# Patient Record
Sex: Male | Born: 1965 | Hispanic: Yes | Marital: Married | Smoking: Never smoker
Health system: Southern US, Community
[De-identification: ages and names within clinical notes are randomized; demographics above are authoritative.]

## PROBLEM LIST (undated history)

## (undated) DIAGNOSIS — E079 Disorder of thyroid, unspecified: Secondary | ICD-10-CM

## (undated) DIAGNOSIS — M1990 Unspecified osteoarthritis, unspecified site: Secondary | ICD-10-CM

## (undated) HISTORY — DX: Unspecified osteoarthritis, unspecified site: M19.90

## (undated) HISTORY — DX: Disorder of thyroid, unspecified: E07.9

## (undated) HISTORY — PX: APPENDECTOMY: SHX54

---

## 2016-11-10 ENCOUNTER — Ambulatory Visit (INDEPENDENT_AMBULATORY_CARE_PROVIDER_SITE_OTHER): Payer: Self-pay

## 2016-11-10 ENCOUNTER — Encounter: Payer: Self-pay | Admitting: Urgent Care

## 2016-11-10 ENCOUNTER — Ambulatory Visit (INDEPENDENT_AMBULATORY_CARE_PROVIDER_SITE_OTHER): Payer: Self-pay | Admitting: Urgent Care

## 2016-11-10 VITALS — BP 124/82 | HR 77 | Temp 98.8°F | Resp 18 | Ht 67.5 in | Wt 181.6 lb

## 2016-11-10 DIAGNOSIS — M542 Cervicalgia: Secondary | ICD-10-CM

## 2016-11-10 DIAGNOSIS — M722 Plantar fascial fibromatosis: Secondary | ICD-10-CM

## 2016-11-10 DIAGNOSIS — R5383 Other fatigue: Secondary | ICD-10-CM

## 2016-11-10 DIAGNOSIS — M2550 Pain in unspecified joint: Secondary | ICD-10-CM

## 2016-11-10 DIAGNOSIS — M5030 Other cervical disc degeneration, unspecified cervical region: Secondary | ICD-10-CM

## 2016-11-10 MED ORDER — METHYLPREDNISOLONE ACETATE 80 MG/ML IJ SUSP
80.0000 mg | Freq: Once | INTRAMUSCULAR | Status: AC
  Administered 2016-11-10: 80 mg via INTRAMUSCULAR

## 2016-11-10 NOTE — Progress Notes (Signed)
MRN: [id number] DOB: 08/20/1965  Subjective:   Derek Knapp is a 51 y.o. male presenting for chief complaint of Joint Pain  Reports 4 month history of neck pain that radiates to his right trapezius, right shoulder. His pain is achy, intermittent, worse with activity. Admits that he can have some radicular pain into his arms bilaterally. Has taken Advil with good relief. He received an injection ~3 months ago at an orthopedic practice for left shoulder pain after showed arthritis. Admits that this did give him good relief. 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 Reports multiple joint pains, aching elbows (L>R), elbow popping, finger joint pain, pain in his feet bilaterally. Has tried shoe inserts with minimal relief. Hydrates very well. Denies fever, trauma, falls, work accidents, car accidents, rashes. Pains started without known incident. He is concerned that he has lupus after researching it online.  Derek Knapp has a current medication list which includes the following prescription(s): acetaminophen and ibuprofen. Also has no allergies on file.  Derek Knapp  has a past medical history of Arthritis and Thyroid disease. Also  has a past surgical history that includes Appendectomy.  Objective:   Vitals: BP 124/82 (BP Location: Right Arm, Patient Position: Sitting, Cuff Size: Large)   Pulse 77   Temp 98.8 F (37.1 C) (Oral)   Resp 18   Ht 5' 7.5" (1.715 m)   Wt 181 lb 9.6 oz (82.4 kg)   SpO2 96%   BMI 28.02 kg/m   Physical Exam  Constitutional: He is oriented to person, place, and time. He appears well-developed and well-nourished.  Cardiovascular: Normal rate.   Pulmonary/Chest: Effort normal.  Musculoskeletal:       Right shoulder: He exhibits tenderness (over AC joint). He exhibits normal range of motion, no swelling, no effusion, no crepitus, no deformity, no spasm and normal strength.       Left shoulder: He exhibits normal range  of motion, no tenderness, no bony tenderness, no swelling, no effusion, no crepitus, no deformity, no laceration, no spasm and normal strength.       Left elbow: He exhibits swelling (over lateral epicondyle). He exhibits normal range of motion, no effusion, no deformity and no laceration. Tenderness found. Lateral epicondyle (with resisted pronation, supination) tenderness noted.       Cervical back: He exhibits tenderness (with ROM testing, L>R; negative Spurling maneuver) and spasm (over trapezius bilaterally). He exhibits normal range of motion, no bony tenderness, no swelling, no edema and no deformity.       Right foot: There is tenderness (over heels and arches bilaterally with dorsiflexion of toes). There is normal range of motion, no bony tenderness, no swelling, normal capillary refill, no crepitus, no deformity and no laceration.       Left foot: There is tenderness. There is normal range of motion, no bony tenderness, no swelling, normal capillary refill, no crepitus, no deformity and no laceration.  Neurological: He is alert and oriented to person, place, and time.    Dg Cervical Spine Complete  Result Date: 11/10/2016 CLINICAL DATA:  Neck pain with radiculopathy. EXAM: CERVICAL SPINE - COMPLETE 4+ VIEW COMPARISON:  No prior. FINDINGS: Diffuse multilevel degenerative change. No acute bony abnormality identified. No evidence of fracture or dislocation. Pulmonary apices are clear. IMPRESSION: Diffuse multilevel degenerative change. No acute abnormality identified. Electronically Signed   By: Maisie Fushomas  Register   On: 11/10/2016 11:29   Assessment and Plan :  1. Neck pain 2. Multiple joint pain 3. Decreased energy 4. Plantar fasciitis, bilateral 5. Degenerative disc disease, cervical - I counseled patient against labs but he insisted that he wanted to know if he has lupus, rheumatoid. Labs are pending. X-ray shows DDD at cervical level. Will use IM Depomedrol given patient's multiple joint  pains. He can otherwise use APAP with ibuprofen. Patient will call in ~2 weeks if he continues to have neck pain, joint pains.   Derek BambergMario Antara Brecheisen, PA-C [facility] [phone] 11/10/2016  10:54 AM

## 2016-11-10 NOTE — Patient Instructions (Addendum)
Tome 500mg  de Tylenol con ibuprofen 400-600mg  cada 6 horas con comida para dolor y inflammacion associado con artritis.    Discopata degenerativa (Degenerative Disk Disease) La discopata degenerativa es una enfermedad causada por los cambios que se producen en los discos intervertebrales a medida que una persona envejece. Los discos intervertebrales son blandos y comprimibles, y se encuentran entre los huesos de la columna (vrtebras). Estos discos actan como amortiguadores de los Experimentgolpes. La discopata degenerativa puede comprometer a toda la columna vertebral. Sin embargo, el cuello y parte inferior de la espalda son las zonas ms comnmente afectadas. Al envejecer, pueden producirse muchos cambios en los discos intervertebrales, por ejemplo:  Pueden secarse y encogerse.  Pueden formarse pequeos desgarros en el recubrimiento exterior duro del disco (anillo).  El espacio intervertebral puede reducirse debido a la prdida de [place].  Pueden desarrollarse crecimientos anormales en el hueso (espolones). Esto puede ejercer presin Rohm and Haassobre las races nerviosas que salen del canal espinal y Psychologist, counsellingprovocar dolor.  El canal espinal puede estrecharse. FACTORES DE RIESGO  Tener sobrepeso.  Tener antecedentes familiares de discopata degenerativa.  Fumar.  El riesgo es mayor si suele levantar objetos pesados o sufre una lesin repentina. Blake DivineSIGNOS Y SNTOMAS Los sntomas varan de [place]a persona a otra y pueden incluir los siguientes:  Dolor de intensidad variable. Algunas personas no tienen dolor, mientras que otras sufren un dolor intenso. La ubicacin del dolor depende de la zona de la columna vertebral que est afectada. ? Si se trata de un disco que se encuentra cerca de la zona del cuello, tendr dolor de cuello o del brazo. ? Si la zona afectada es la parte inferior de la espalda, tendr dolor de Genevaespalda, de los glteos o las piernas.  Dolor que empeora al agacharse, SLM Corporationlevantar los brazos o Education officer, environmentalrealizar  movimientos de torsin.  Dolor que puede comenzar de [place] Hyde Parkmanera gradual y Customer service managerluego empeorar con el paso del Eagle Rocktiempo. Tambin puede aparecer despus de sufrir una lesin leve o importante.  Hormigueo o adormecimiento de los brazos o las piernas. DIAGNSTICO El mdico le preguntar cules son sus sntomas y Environmental manageracerca las actividades y los hbitos que pueden causar Chief Technology Officerel dolor. Adems, YUM! Brandssobre las lesiones y las enfermedades que tuvo o los tratamientos que recibi. El Office Depotmdico le har un examen para determinar el rango de movimiento posible de la zona afectada, controlar la fuerza que tiene en las extremidades, as Immunologistcomo la sensibilidad en las zonas de los brazos y las piernas inervadas por diferentes races nerviosas. Tambin se Fish farm managerle pueden realizar los siguientes estudios:  Una radiografa de la columna.  Otros estudios por imgenes, como una RM. TRATAMIENTO El Brewing technologistmdico le aconsejar cul es el mejor plan de tratamiento. El tratamiento puede incluir lo siguiente:  Medicamentos.  Ejercicios de rehabilitacin. INSTRUCCIONES PARA EL CUIDADO EN EL HOGAR  Siga las tcnicas adecuadas para caminar y Lexicographerlevantar objetos pesados como se lo haya recomendado el mdico.  Mantenga una buena Marshallpostura.  Haga ejercicios regularmente como se lo haya recomendado el mdico.  Haga ejercicios de relajacin.  Cambie los hbitos para sentarse, ponerse de pie y dormir como se lo haya recomendado el mdico.  Cambie frecuentemente de posicin.  Mantenga un peso saludable o baje de peso como se lo haya recomendado el mdico.  No consuma ningn producto que contenga tabaco, lo que incluye cigarrillos, tabaco de Theatre managermascar o Administrator, Civil Servicecigarrillos electrnicos. Si necesita ayuda para dejar de fumar, consulte al mdico.  Use calzado que tenga el soporte correcto.  Tome los medicamentos solamente como  se lo haya indicado el mdico. SOLICITE ATENCIN MDICA SI:  El dolor no desaparece en el trmino de 1 a 4semanas.  Pierde drsticamente de peso o  el apetito. SOLICITE ATENCIN MDICA DE INMEDIATO SI:  El dolor es intenso.  Tiene Colgatedebilidad en los brazos, las manos o las piernas.  Comienza a perder el control de la vejiga o los intestinos.  Tiene fiebre o sudores nocturnos. ASEGRESE DE QUE:  Comprende estas instrucciones.  Controlar su afeccin.  Recibir ayuda de inmediato si no mejora o si empeora. Esta informacin no tiene Theme park managercomo fin reemplazar el consejo del mdico. Asegrese de hacerle al mdico cualquier pregunta que tenga. Document Released: 07/15/2008 Document Revised: 04/19/2014 Document Reviewed: 07/31/2013 Elsevier Interactive Patient Education  2018 Elsevier Inc.     Fascitis plantar (Plantar Fasciitis) La fascitis plantar es una afeccin dolorosa que se produce en el taln. Ocurre cuando la banda de tejido que AT&Tconecta los dedos con el hueso del taln (fascia plantar) se irrita. Esto puede ocurrir despus de Academic librarianhacer mucho ejercicio u otras actividades repetitivas (lesin por uso excesivo). El dolor de la fascitis plantar puede ser de leve (irritacin) a intenso, y en los casos ms agudos puede dificultar que la persona camine o se [place]anchesmueva. Por lo general, el dolor es peor a la maana o despus de Personal assistantpermanecer sentado o acostado durante un perodo. CAUSAS Este trastorno puede ser causado por:  Estar de pie durante largos perodos.  Usar zapatos que no calcen bien.  Practicar actividades de alto impacto, como correr, o hacer ejercicios Corning Incorporatedaerbicos o ballet.  Tener sobrepeso.  Tener una forma de caminar (marcha) anormal.  Tener los msculos de la pantorrilla tensos.  Tener el arco alto en los pies.  Comenzar una nueva actividad fsica. SNTOMAS El sntoma principal de esta afeccin es el dolor en el taln. Otros sntomas pueden ser los siguientes:  Dolor que empeora despus de una actividad o un ejercicio.  Dolor ms intenso a la maana o despus de Lawyerdescansar.  Dolor que desaparece despus de caminar durante  unos minutos. DIAGNSTICO Esta afeccin se puede diagnosticar en funcin de los signos y los sntomas. El mdico Location managertambin le realizar un examen fsico para controlar si tiene lo siguiente:  Un rea dolorida en la parte inferior del pie.  El arco alto.  Dolor al Doctor, general practicemover el pie.  Dificultad para mover el pie. Tambin puede necesitar estudios por imgenes para confirmar el diagnstico. Estos pueden incluir los siguientes:  Radiografas.  Ecografa.  Resonancia magntica. TRATAMIENTO El tratamiento de la fascitis plantar depende de la gravedad de la afeccin. El tratamiento puede incluir lo siguiente:  Reposo, hielo y analgsicos de venta libre para Human resources officercontrolar el dolor.  Ejercicios para estirar las pantorrillas y la fascia plantar.  Una frula que UGI Corporationmantiene el pie estirado y Maltahacia arriba mientras usted duerme (frula nocturna).  Fisioterapia para Eastman Kodakaliviar los sntomas y Physiological scientistevitar problemas en el futuro.  Inyecciones de cortisona para Engineer, materialsaliviar el dolor intenso.  Tratamiento con ondas de choque extracorpreas para estimular con impulsos elctricos la fascia plantar lesionada. Esto suele usarse como un ltimo recurso antes de la Azerbaijanciruga.  Ciruga, si los otros tratamientos no han funcionado despus de 12meses. INSTRUCCIONES PARA EL CUIDADO EN EL HOGAR  Tome los medicamentos solamente como se lo haya indicado el mdico.  Evite las actividades que causan dolor.  Frote la parte inferior del pie sobre una bolsa de hielo o una botella de agua fra. Haga esto durante 20minutos, de 3a 4veces al  da.  Realice estiramientos simples como se lo haya indicado el mdico.  Trate de usar calzado deportivo con amortiguacin de aire o gel, o plantillas blandas.  Si el mdico se lo ha indicado, use una frula nocturna para dormir.  Cumpla con todas las visitas de control, segn le indique su mdico. PREVENCIN  No realice ejercicios ni actividades que le causen dolor en el taln.  Considere la  posibilidad de Corporate investment banker actividades de bajo impacto si sigue teniendo problemas.  Pierda peso si lo necesita. La mejor forma de prevenir la fascitis plantar es evitar las actividades que lesionan ms la fascia plantar. SOLICITE ATENCIN MDICA SI:  Los sntomas no desaparecen despus del tratamiento en su casa.  El dolor [place].  El Artist la capacidad de moverse o de Education officer, environmental las actividades diarias. Esta informacin no tiene Theme park manager el consejo del mdico. Asegrese de hacerle al mdico cualquier pregunta que tenga. Document Released: 01/06/2005 Document Revised: 07/21/2015 Document Reviewed: 02/06/2014 Elsevier Interactive Patient Education  2018 ArvinMeritor.     IF you received an x-ray today, you will receive an invoice from [facility] Radiology. Please contact [facility] Radiology at [phone] with questions or concerns regarding your invoice.   IF you received labwork today, you will receive an invoice from [facility]. Please contact [facility] at [phone] with questions or concerns regarding your invoice.   Our billing staff will not be able to assist you with questions regarding bills from these companies.  You will be contacted with the lab results as soon as they are available. The fastest way to get your results is to activate your My Chart account. Instructions are located on the last page of this paperwork. If you have not heard from [place] regarding the results in 2 weeks, please contact this office.

## 2016-11-11 LAB — COMPREHENSIVE METABOLIC PANEL
ALK PHOS: 104 IU/L (ref 39–117)
ALT: 24 IU/L (ref 0–44)
AST: 27 IU/L (ref 0–40)
Albumin/Globulin Ratio: 1.1 — ABNORMAL LOW (ref 1.2–2.2)
Albumin: 4.2 g/dL (ref 3.5–5.5)
BILIRUBIN TOTAL: 0.2 mg/dL (ref 0.0–1.2)
BUN/Creatinine Ratio: 15 (ref 9–20)
BUN: 12 mg/dL (ref 6–24)
CHLORIDE: 102 mmol/L (ref 96–106)
CO2: 20 mmol/L (ref 20–29)
CREATININE: 0.8 mg/dL (ref 0.76–1.27)
Calcium: 9.2 mg/dL (ref 8.7–10.2)
GFR calc non Af Amer: 103 mL/min/{1.73_m2} (ref >59)
GFR, EST AFRICAN AMERICAN: 119 mL/min/{1.73_m2} (ref >59)
GLUCOSE: 100 mg/dL — AB (ref 65–99)
Globulin, Total: 3.7 g/dL (ref 1.5–4.5)
Potassium: 4 mmol/L (ref 3.5–5.2)
Sodium: 140 mmol/L (ref 134–144)
TOTAL PROTEIN: 7.9 g/dL (ref 6.0–8.5)

## 2016-11-11 LAB — CBC
HEMOGLOBIN: 13.9 g/dL (ref 13.0–17.7)
Hematocrit: 42 % (ref 37.5–51.0)
MCH: 31.4 pg (ref 26.6–33.0)
MCHC: 33.1 g/dL (ref 31.5–35.7)
MCV: 95 fL (ref 79–97)
PLATELETS: 268 10*3/uL (ref 150–379)
RBC: 4.42 x10E6/uL (ref 4.14–5.80)
RDW: 12.9 % (ref 12.3–15.4)
WBC: 6.4 10*3/uL (ref 3.4–10.8)

## 2016-11-11 LAB — ANA W/REFLEX: ANA: NEGATIVE

## 2016-11-11 LAB — SEDIMENTATION RATE: SED RATE: 18 mm/h (ref 0–30)

## 2016-11-11 LAB — TSH: TSH: 2.16 u[IU]/mL (ref 0.450–4.500)

## 2016-11-18 NOTE — Addendum Note (Signed)
Addended by: Baldwin CrownJOHNSON, SHAQUETTA D on: 11/18/2016 09:43 AM   Modules accepted: Orders

## 2016-11-20 ENCOUNTER — Other Ambulatory Visit: Payer: Self-pay | Admitting: Urgent Care

## 2016-11-20 DIAGNOSIS — R7989 Other specified abnormal findings of blood chemistry: Secondary | ICD-10-CM

## 2016-11-20 DIAGNOSIS — R768 Other specified abnormal immunological findings in serum: Secondary | ICD-10-CM

## 2016-11-20 DIAGNOSIS — M2550 Pain in unspecified joint: Secondary | ICD-10-CM

## 2016-11-20 LAB — CYCLIC CITRUL PEPTIDE ANTIBODY, IGG/IGA

## 2018-06-02 IMAGING — DX DG CERVICAL SPINE COMPLETE 4+V
6 series · 6 of 6 positions shown · non-contrast
Comparison: No prior.

CLINICAL DATA: Neck pain with radiculopathy.

EXAM:
CERVICAL SPINE - COMPLETE 4+ VIEW

[c-spine lat]
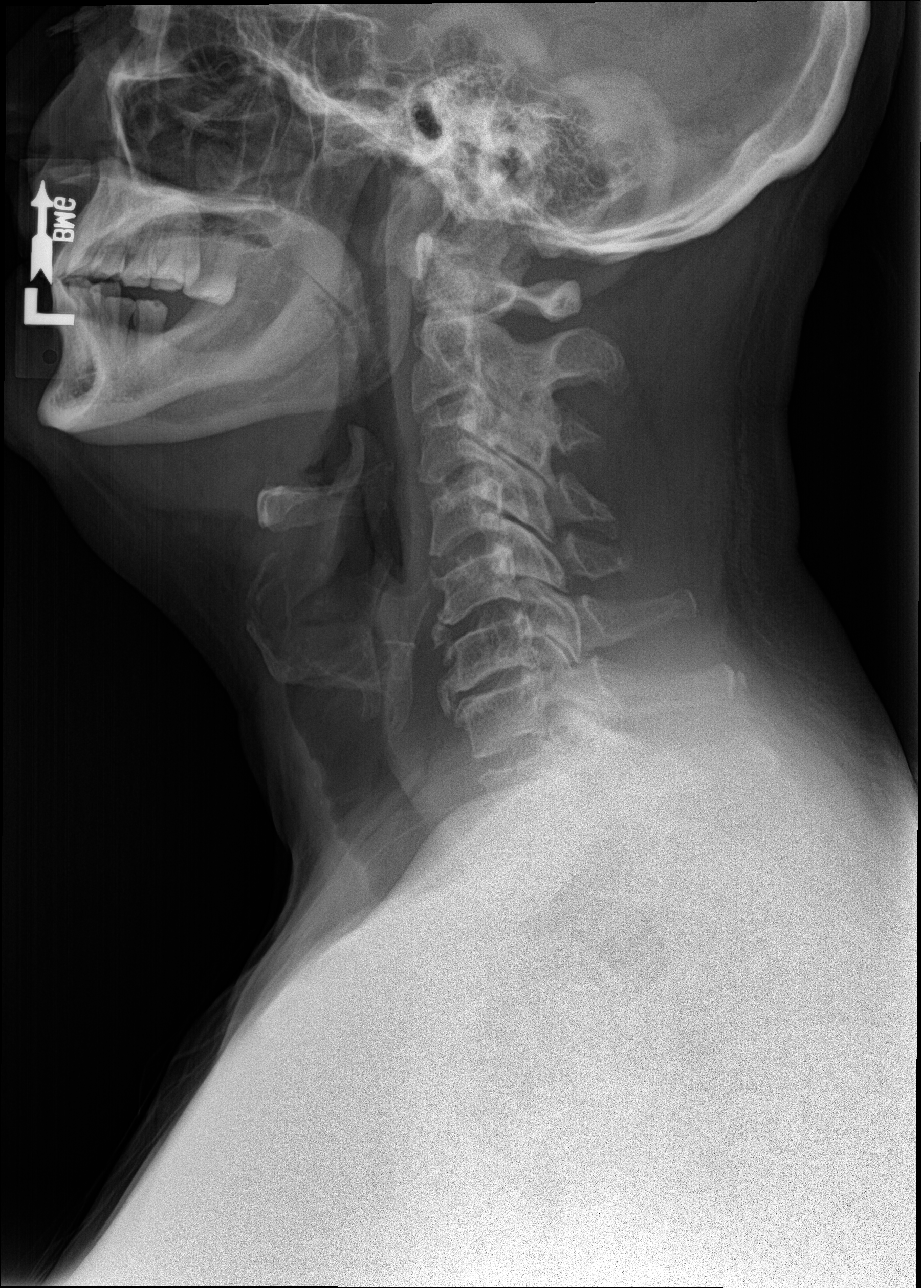

[c-spine obl (1 of 2)]
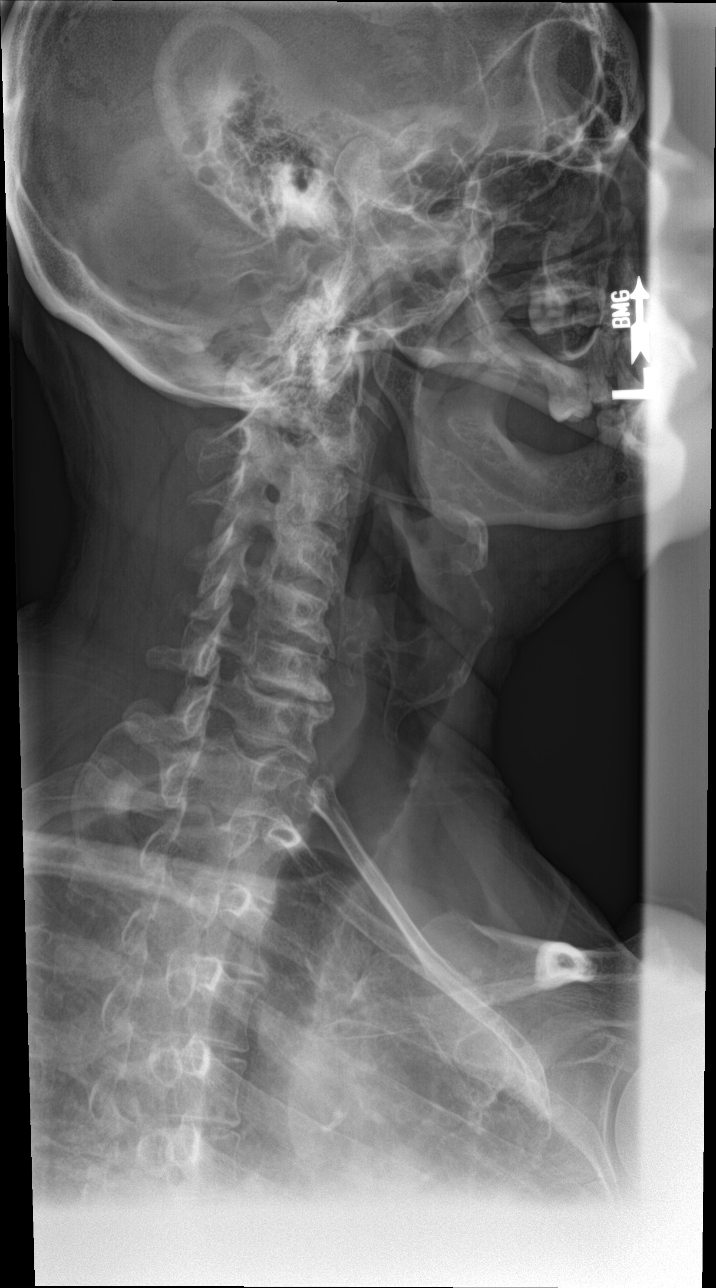

[c-spine obl (2 of 2)]
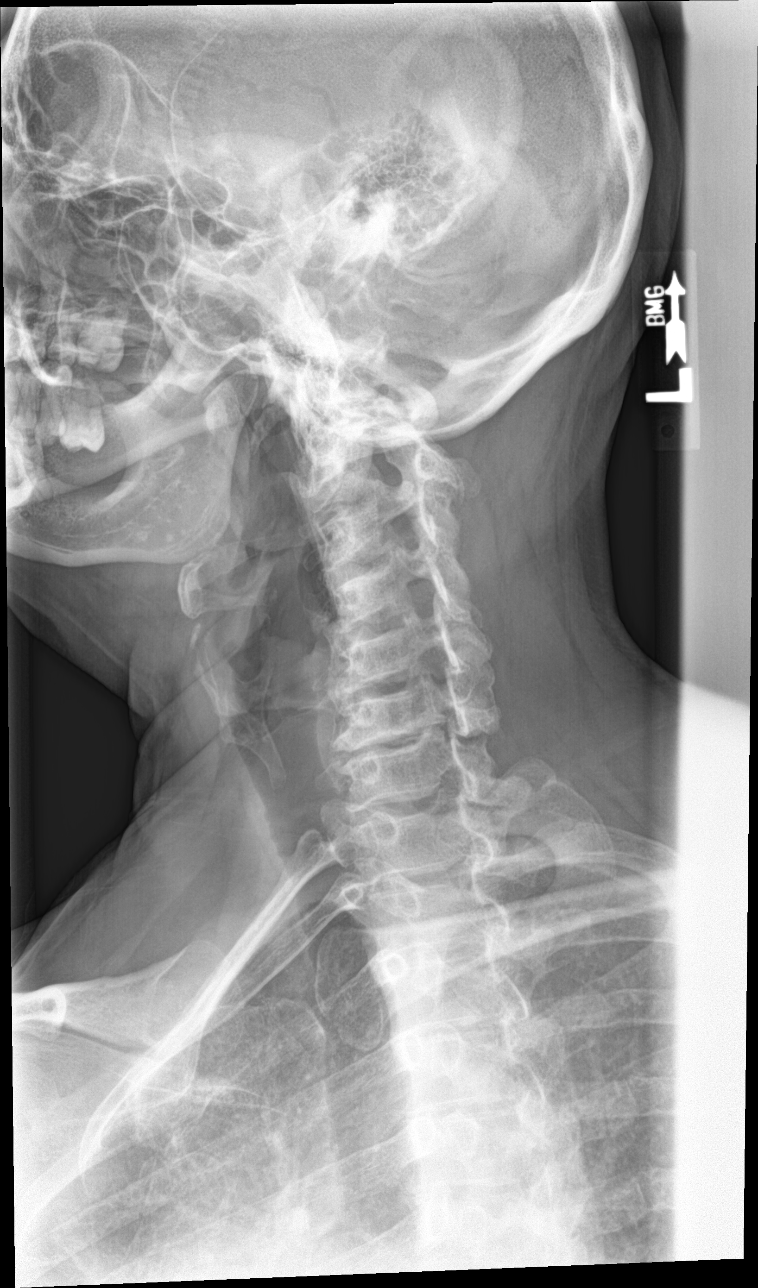

[c-spine ap]
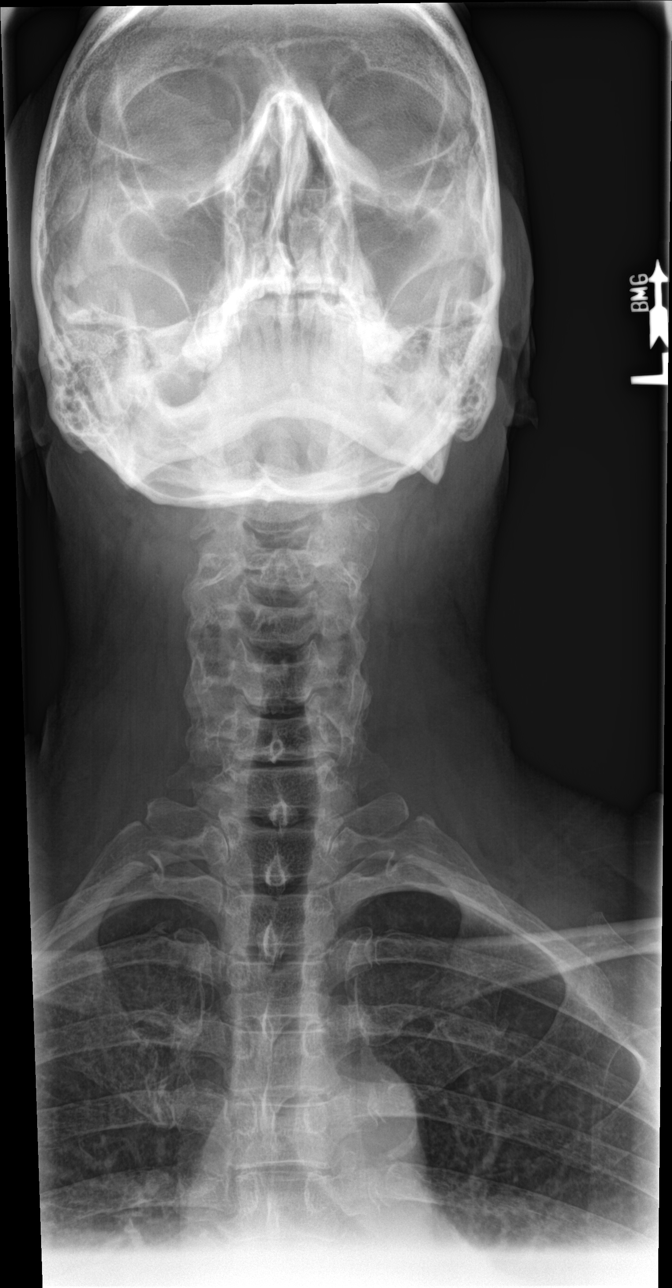

[c-spine open mouth]
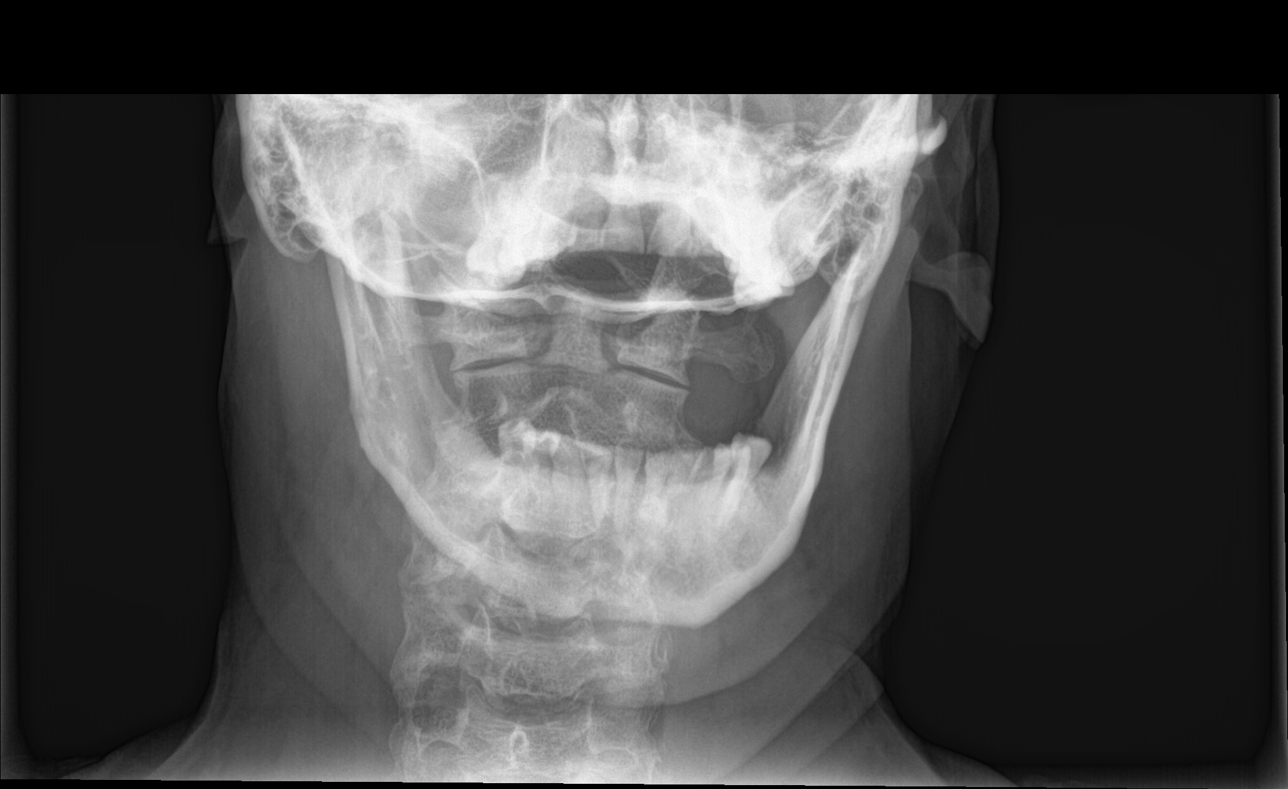

[c-spine swimmers]
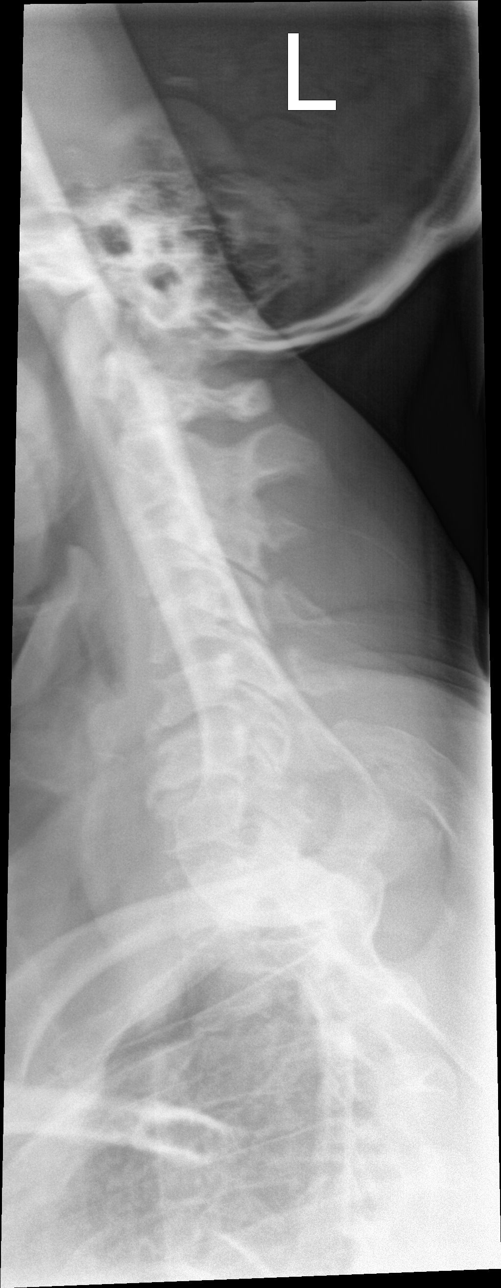

[6 of 6 positions shown; findings below may reference images not displayed]

FINDINGS: Diffuse multilevel degenerative change. No acute bony abnormality
identified. No evidence of fracture or dislocation. Pulmonary apices
are clear.
IMPRESSION: Diffuse multilevel degenerative change. No acute abnormality
identified.

## 2019-09-11 DEATH — deceased
